# Patient Record
Sex: Female | Born: 1965 | Race: Black or African American | Hispanic: No
Health system: Southern US, Community
[De-identification: ages and names within clinical notes are randomized; demographics above are authoritative.]

---

## 2020-10-20 ENCOUNTER — Emergency Department (HOSPITAL_COMMUNITY)
Admission: EM | Admit: 2020-10-20 | Discharge: 2020-10-20 | Disposition: A | Discharge status: Home / Self Care | Payer: Medicare Other | Attending: Emergency Medicine | Admitting: Emergency Medicine

## 2020-10-20 ENCOUNTER — Emergency Department (HOSPITAL_COMMUNITY): Payer: Medicare Other

## 2020-10-20 ENCOUNTER — Encounter (HOSPITAL_COMMUNITY): Payer: Self-pay | Admitting: *Deleted

## 2020-10-20 DIAGNOSIS — M545 Low back pain, unspecified: Secondary | ICD-10-CM

## 2020-10-20 DIAGNOSIS — R1032 Left lower quadrant pain: Secondary | ICD-10-CM | POA: Insufficient documentation

## 2020-10-20 DIAGNOSIS — M25552 Pain in left hip: Secondary | ICD-10-CM | POA: Diagnosis not present

## 2020-10-20 LAB — URINALYSIS, ROUTINE W REFLEX MICROSCOPIC
Bilirubin Urine: NEGATIVE
Glucose, UA: NEGATIVE mg/dL
Hgb urine dipstick: NEGATIVE
Ketones, ur: NEGATIVE mg/dL
Leukocytes,Ua: NEGATIVE
Nitrite: NEGATIVE
Protein, ur: NEGATIVE mg/dL
Specific Gravity, Urine: 1.03 (ref 1.005–1.030)
pH: 7 (ref 5.0–8.0)

## 2020-10-20 LAB — CBC WITH DIFFERENTIAL/PLATELET
Abs Immature Granulocytes: 0.02 10*3/uL (ref 0.00–0.07)
Basophils Absolute: 0.1 10*3/uL (ref 0.0–0.1)
Basophils Relative: 1 %
Eosinophils Absolute: 0 10*3/uL (ref 0.0–0.5)
Eosinophils Relative: 1 %
HCT: 44.7 % (ref 36.0–46.0)
Hemoglobin: 13.8 g/dL (ref 12.0–15.0)
Immature Granulocytes: 0 %
Lymphocytes Relative: 37 %
Lymphs Abs: 2 10*3/uL (ref 0.7–4.0)
MCH: 33.4 pg (ref 26.0–34.0)
MCHC: 30.9 g/dL (ref 30.0–36.0)
MCV: 108.2 fL — ABNORMAL HIGH (ref 80.0–100.0)
Monocytes Absolute: 0.4 10*3/uL (ref 0.1–1.0)
Monocytes Relative: 7 %
Neutro Abs: 2.8 10*3/uL (ref 1.7–7.7)
Neutrophils Relative %: 54 %
Platelets: 127 10*3/uL — ABNORMAL LOW (ref 150–400)
RBC: 4.13 MIL/uL (ref 3.87–5.11)
RDW: 14.5 % (ref 11.5–15.5)
WBC: 5.3 10*3/uL (ref 4.0–10.5)
nRBC: 0 % (ref 0.0–0.2)

## 2020-10-20 LAB — COMPREHENSIVE METABOLIC PANEL
ALT: 31 U/L (ref 0–44)
AST: 69 U/L — ABNORMAL HIGH (ref 15–41)
Albumin: 3.7 g/dL (ref 3.5–5.0)
Alkaline Phosphatase: 130 U/L — ABNORMAL HIGH (ref 38–126)
Anion gap: 10 (ref 5–15)
BUN: 12 mg/dL (ref 6–20)
CO2: 26 mmol/L (ref 22–32)
Calcium: 9.4 mg/dL (ref 8.9–10.3)
Chloride: 100 mmol/L (ref 98–111)
Creatinine, Ser: 0.46 mg/dL (ref 0.44–1.00)
GFR, Estimated: >60 mL/min (ref >60)
Glucose, Bld: 98 mg/dL (ref 70–99)
Potassium: 4 mmol/L (ref 3.5–5.1)
Sodium: 136 mmol/L (ref 135–145)
Total Bilirubin: 1.5 mg/dL — ABNORMAL HIGH (ref 0.3–1.2)
Total Protein: 8.8 g/dL — ABNORMAL HIGH (ref 6.5–8.1)

## 2020-10-20 MED ORDER — IOHEXOL 350 MG/ML SOLN
100.0000 mL | Freq: Once | INTRAVENOUS | Status: AC | PRN
  Administered 2020-10-20: 80 mL via INTRAVENOUS

## 2020-10-20 MED ORDER — LIDOCAINE 5 % EX PTCH: 1.0000 | MEDICATED_PATCH | CUTANEOUS | 0 refills | Status: AC

## 2020-10-20 MED ORDER — PREDNISONE 50 MG PO TABS: 50.0000 mg | ORAL_TABLET | Freq: Every day | ORAL | 0 refills | Status: AC

## 2020-10-20 MED ORDER — SODIUM CHLORIDE (PF) 0.9 % IJ SOLN
INTRAMUSCULAR | Status: AC
  Filled 2020-10-20: qty 50

## 2020-10-20 MED ORDER — METHOCARBAMOL 500 MG PO TABS: 500.0000 mg | ORAL_TABLET | Freq: Two times a day (BID) | ORAL | 0 refills | Status: AC

## 2020-10-20 MED ORDER — OXYCODONE HCL 5 MG PO TABS: 5.0000 mg | ORAL_TABLET | Freq: Four times a day (QID) | ORAL | 0 refills | Status: AC | PRN

## 2020-10-20 MED ORDER — MORPHINE SULFATE (PF) 4 MG/ML IV SOLN
4.0000 mg | Freq: Once | INTRAVENOUS | Status: AC
  Administered 2020-10-20: 4 mg via INTRAVENOUS
  Filled 2020-10-20: qty 1

## 2020-10-20 MED ORDER — ONDANSETRON HCL 4 MG/2ML IJ SOLN
4.0000 mg | Freq: Once | INTRAMUSCULAR | Status: AC
  Administered 2020-10-20: 4 mg via INTRAVENOUS
  Filled 2020-10-20: qty 2

## 2020-10-20 MED ORDER — SODIUM CHLORIDE 0.9 % IV BOLUS
1000.0000 mL | Freq: Once | INTRAVENOUS | Status: AC
  Administered 2020-10-20: 1000 mL via INTRAVENOUS

## 2020-10-20 NOTE — ED Triage Notes (Signed)
Pt complains of left pelvic/hip pain radiating lto eft knee x 2 days. No injury.

## 2020-10-20 NOTE — ED Provider Notes (Signed)
[facility] DEPT Provider Note   CSN: [id number] Arrival date & time: 10/20/20  1253     History Chief Complaint  Patient presents with   Hip Pain    Heather Camacho is a 55 y.o. female with past medical history significant for multiple lumbar surgeries who presents for evaluation of left lower back, left lower abdominal pain.  Pain began 2 days ago.  Occasionally radiate into the leg.  Does not feel similar to prior back pain.  No history of IV drug use, bowel or bladder incontinence, saddle paresthesia.  No recent falls or injuries.  Pain worse with movement as well as urinating.  No dysuria, hematuria.  No history of kidney stones.  Does have some abdominal pain to left lower quadrant however no diarrhea, melena or blood per rectum.  Rates pain a 10/10.  No vaginal discharge, no concerns for STDs.  History obtained from patient and past medical records.  No interpreter used.  HPI     History reviewed. No pertinent past medical history.  There are no problems to display for this patient.   History reviewed. No pertinent surgical history.   OB History   No obstetric history on file.     No family history on file.     Home Medications Prior to Admission medications   Medication Sig Start Date End Date Taking? Authorizing Provider  lidocaine (LIDODERM) 5 % Place 1 patch onto the skin daily. Remove & Discard patch within 12 hours or as directed by MD 10/20/20  Yes Lyle Niblett A, PA-C  methocarbamol (ROBAXIN) 500 MG tablet Take 1 tablet (500 mg total) by mouth 2 (two) times daily. 10/20/20  Yes Kamille Toomey A, PA-C  oxyCODONE (ROXICODONE) 5 MG immediate release tablet Take 1 tablet (5 mg total) by mouth every 6 (six) hours as needed for up to 3 days for severe pain. 10/20/20 10/23/20 Yes Rashana Andrew A, PA-C  predniSONE (DELTASONE) 50 MG tablet Take 1 tablet (50 mg total) by mouth daily for 5 days. 10/20/20 10/25/20 Yes Yides Saidi A,  PA-C    Allergies    Patient has no allergy information on record.  Review of Systems   Review of Systems  Constitutional: Negative.   HENT: Negative.    Respiratory: Negative.    Cardiovascular: Negative.   Gastrointestinal:  Positive for abdominal pain. Negative for abdominal distention, anal bleeding, blood in stool, constipation, diarrhea, nausea and vomiting.  Genitourinary: Negative.   Musculoskeletal:  Positive for back pain. Negative for gait problem, neck pain and neck stiffness.  Skin: Negative.   Neurological: Negative.   All other systems reviewed and are negative.  Physical Exam Updated Vital Signs BP (!) 159/73   Pulse 77   Temp 98.5 F (36.9 C) (Oral)   Resp 18   SpO2 99%   Physical Exam Vitals and nursing note reviewed.  Constitutional:      General: She is not in acute distress.    Appearance: She is well-developed. She is not ill-appearing, toxic-appearing or diaphoretic.  HENT:     Head: Normocephalic and atraumatic.     Nose: Nose normal.     Mouth/Throat:     Mouth: Mucous membranes are moist.  Eyes:     Pupils: Pupils are equal, round, and reactive to light.  Cardiovascular:     Rate and Rhythm: Normal rate.     Pulses: Normal pulses.     Heart sounds: Normal heart sounds.  Pulmonary:  Effort: Pulmonary effort is normal. No respiratory distress.     Breath sounds: Normal breath sounds.  Abdominal:     General: Bowel sounds are normal. There is no distension.     Palpations: Abdomen is soft.     Tenderness: There is abdominal tenderness.  Musculoskeletal:        General: Tenderness present. No swelling, deformity or signs of injury. Normal range of motion.     Cervical back: Normal range of motion.     Right lower leg: No edema.     Left lower leg: No edema.     Comments: Diffuse tenderness to left lumbar paraspinal muscles.  Pain with range of motion to left leg however no gross positive straight leg raise bilaterally.  Compartments  soft.  No bony tenderness to lower extremities.  Intact sensation  Skin:    General: Skin is warm and dry.     Capillary Refill: Capillary refill takes less than 2 seconds.     Comments: No edema, erythema or warmth.  Neurological:     General: No focal deficit present.     Mental Status: She is alert and oriented to person, place, and time.     Comments: Ambulatory without difficulty Equal strength bilaterally Intact sensation bilaterally  Psychiatric:        Mood and Affect: Mood normal.    ED Results / Procedures / Treatments   Labs (all labs ordered are listed, but only abnormal results are displayed) Labs Reviewed  CBC WITH DIFFERENTIAL/PLATELET - Abnormal; Notable for the following components:      Result Value   MCV 108.2 (*)    Platelets 127 (*)    All other components within normal limits  COMPREHENSIVE METABOLIC PANEL - Abnormal; Notable for the following components:   Total Protein 8.8 (*)    AST 69 (*)    Alkaline Phosphatase 130 (*)    Total Bilirubin 1.5 (*)    All other components within normal limits  URINALYSIS, ROUTINE W REFLEX MICROSCOPIC    EKG None  Radiology DG Lumbar Spine Complete  Result Date: 10/20/2020 CLINICAL DATA:  LEFT lower back pain for 2 days, constant and radiating to lateral LEFT knee, multiple prior back surgeries EXAM: LUMBAR SPINE - COMPLETE 4+ VIEW COMPARISON:  None FINDINGS: Five non-rib-bearing lumbar vertebra. Levoconvex scoliosis. Prior posterior fusion L4-S1. Diffuse osseous demineralization. Disc space narrowing and minimal endplate spur formation at L2-L3. Vertebral body heights maintained without fracture or subluxation. No bone destruction or gross spondylolysis. SI joints preserved. IMPRESSION: Prior L4-S1 posterior fusion. Scattered degenerative disc disease changes lumbar spine with mild levoconvex scoliosis. No acute abnormalities. Electronically Signed   By: Lavonia Dana M.D.   On: 10/20/2020 14:19   CT Abdomen Pelvis W  Contrast  Result Date: 10/20/2020 CLINICAL DATA:  Left lower quadrant abdominal pain EXAM: CT ABDOMEN AND PELVIS WITH CONTRAST TECHNIQUE: Multidetector CT imaging of the abdomen and pelvis was performed using the standard protocol following bolus administration of intravenous contrast. CONTRAST:  52m OMNIPAQUE IOHEXOL 350 MG/ML SOLN COMPARISON:  None. FINDINGS: Lower chest: No acute pleural or parenchymal lung disease. Multi lead pacemaker identified. Hepatobiliary: Mild hepatic steatosis without focal liver abnormality. No biliary duct dilation. The gallbladder is unremarkable. Pancreas: Unremarkable. No pancreatic ductal dilatation or surrounding inflammatory changes. Spleen: Normal in size without focal abnormality. Adrenals/Urinary Tract: Adrenal glands are unremarkable. Kidneys are normal, without renal calculi, focal lesion, or hydronephrosis. Bladder is unremarkable. Stomach/Bowel: No bowel obstruction or ileus. Normal appendix  right lower quadrant. No bowel wall thickening or inflammatory change. Vascular/Lymphatic: No significant vascular findings are present. No enlarged abdominal or pelvic lymph nodes. Reproductive: Status post hysterectomy. No adnexal masses. Other: No free fluid or free gas.  No abdominal wall hernia. Musculoskeletal: No acute or destructive bony lesions. Postsurgical changes from L4 through S1. Reconstructed images demonstrate no additional findings. IMPRESSION: 1. No acute intra-abdominal or intrapelvic process. 2. Mild hepatic steatosis. Electronically Signed   By: Randa Ngo M.D.   On: 10/20/2020 15:49   CT L-SPINE NO CHARGE  Result Date: 10/20/2020 CLINICAL DATA:  Left low back pain radiating into the lateral aspect of the left for 2 days. History of prior lumbar surgery. No known injury. EXAM: CT LUMBAR SPINE WITHOUT CONTRAST TECHNIQUE: Multidetector CT imaging of the lumbar spine was performed without intravenous contrast administration. Multiplanar CT image  reconstructions were also generated. COMPARISON:  Plain films lumbar spine today. FINDINGS: Segmentation: Standard. Alignment: Maintained. Vertebrae: No fracture or focal lesion. The patient is status post L4-S1 posterior fusion and L4-5 anterior fusion. There is solid bridging bone across the disc interspaces and a solid posterior fusion mass at both levels. No hardware complication. Paraspinal and other soft tissues: See report of dedicated abdomen and pelvis CT today. Disc levels: T12-L1: Mild facet degenerative change. Otherwise negative. L1-2: Negative. L2-3: Mild facet degenerative change, more notable on the right. Otherwise negative. L3-4: Moderate bilateral facet degenerative disease. Otherwise negative. L4-5: Status post discectomy and fusion with a wide laminectomy defect on the right. No stenosis. L5-S1: Status post discectomy and fusion with a wide laminectomy defect on the right. No stenosis. IMPRESSION: No acute abnormality or finding to explain the patient's symptoms. The central canal and foramina appear open at all levels. The patient is status post L4-S1 fusion without evidence of complication. Electronically Signed   By: Inge Rise M.D.   On: 10/20/2020 15:34    Procedures Procedures   Medications Ordered in ED Medications  sodium chloride (PF) 0.9 % injection (has no administration in time range)  ondansetron (ZOFRAN) injection 4 mg (4 mg Intravenous Given 10/20/20 1432)  sodium chloride 0.9 % bolus 1,000 mL (0 mLs Intravenous Stopped 10/20/20 1559)  morphine 4 MG/ML injection 4 mg (4 mg Intravenous Given 10/20/20 1432)  iohexol (OMNIPAQUE) 350 MG/ML injection 100 mL (80 mLs Intravenous Contrast Given 10/20/20 1503)    ED Course  I have reviewed the triage vital signs and the nursing notes.  Pertinent labs & imaging results that were available during my care of the patient were reviewed by me and considered in my medical decision making (see chart for details).  Patient here  for evaluation of left lower back pain.  Afebrile, nonseptic, not ill-appearing occasionally radiates into her left lower leg.  Occasionally also has some left lower quadrant abdominal pain.  No change in bowel movements, melena, bright blood per rectum.  No urinary complaints.  Pain worse with movement as well as urinating.  No bowel or bladder incontinence, saddle paresthesia, history of IV drug use.  She is ambulatory here without difficulty.  Able to reproduce her pain on exam.  Given she has had some intermittent left lower abdominal pain will obtain labs and imaging.  Labs and imaging personally reviewed and interpreted: CBC without leukocytosis Metabolic panel with mild elevation alk phos, T bili, right upper quadrant abdomen soft, nontender.  No dilated ducts on CT, will have follow-up outpatient with PCP.  Does have hepatic steatosis on imaging UA negative  for infection CT abdomen and pelvis without any acute abnormality Reconstituted CT lumbar without any acute abnormality  Patient reassessed.  She is tolerating p.o. intake.  Pain controlled.  Given I am able to reproduce her pain feels is likely musculoskeletal in etiology.  Low suspicion for acute intra-abdominal, neurosurgical emergency as cause of her pain.  DC home with symptomatic management.  On repeat exam patient does not have a surgical abdomin and there are no peritoneal signs.  No indication of appendicitis, bowel obstruction, bowel perforation, cholecystitis, diverticulitis, AAA, dissection, PID or ectopic pregnancy, occult fracture.    The patient has been appropriately medically screened and/or stabilized in the ED. I have low suspicion for any other emergent medical condition which would require further screening, evaluation or treatment in the ED or require inpatient management.  Patient is hemodynamically stable and in no acute distress.  Patient able to ambulate in department prior to ED.  Evaluation does not show acute  pathology that would require ongoing or additional emergent interventions while in the emergency department or further inpatient treatment.  I have discussed the diagnosis with the patient and answered all questions.  Pain is been managed while in the emergency department and patient has no further complaints prior to discharge.  Patient is comfortable with plan discussed in room and is stable for discharge at this time.  I have discussed strict return precautions for returning to the emergency department.  Patient was encouraged to follow-up with PCP/specialist refer to at discharge.        MDM Rules/Calculators/A&P                           Final Clinical Impression(s) / ED Diagnoses Final diagnoses:  Low back pain    Rx / DC Orders ED Discharge Orders          Ordered    oxyCODONE (ROXICODONE) 5 MG immediate release tablet  Every 6 hours PRN        10/20/20 1702    lidocaine (LIDODERM) 5 %  Every 24 hours        10/20/20 1702    methocarbamol (ROBAXIN) 500 MG tablet  2 times daily        10/20/20 1702    predniSONE (DELTASONE) 50 MG tablet  Daily        10/20/20 1702             Halo Laski A, PA-C 10/20/20 1703    Valarie Merino, MD 10/21/20 [phone]

## 2020-10-20 NOTE — ED Provider Notes (Signed)
Emergency Medicine Provider Triage Evaluation Note  Heather Camacho , a 55 y.o. female  was evaluated in triage.  Pt complains of left lower back pain.  Multiple prior back surgeries.  Does not feel similar.  Pain began 2 days ago.  Pain is constant.  Does radiate into left lateral aspect knee.  Pain worse with movement as well as urinating.  No bowel or bladder incontinence, saddle paresthesia.  No dysuria, hematuria.  No history of kidney stones.  Review of Systems  Positive: Left lower back pain Negative: Dysuria, hematuria, chest pain, shortness of breath, saddle paresthesia  Physical Exam  BP (!) 166/87 (BP Location: Right Arm)   Pulse 86   Temp 98.5 F (36.9 C) (Oral)   Resp 18   SpO2 100%  Gen:   Awake, no distress   Resp:  Normal effort  MSK:   Moves extremities without difficulty  Other:  Intact sensation to LE  Medical Decision Making  Medically screening exam initiated at 1:18 PM.  Appropriate orders placed.  [name] was informed that the remainder of the evaluation will be completed by another provider, this initial triage assessment does not replace that evaluation, and the importance of remaining in the ED until their evaluation is complete.  Lower back pain  VS stable, labs, imaging ordered   [name], PA-C 10/20/20 1319    [name], MD 10/21/20 [phone]

## 2020-10-20 NOTE — Discharge Instructions (Addendum)
Take the medications as prescribed.  Do not drive or operate heavy machinery while taking these medications.  Follow-up with your primary care doctor in a few days if your symptoms do not improve.

## 2020-10-20 NOTE — ED Notes (Signed)
An After Visit Summary was printed and given to the patient. Discharge instructions given and no further questions at this time.  

## 2020-10-20 NOTE — ED Notes (Signed)
Pt in restroom to give UA.

## 2022-01-25 IMAGING — CT CT ABD-PELV W/ CM
3 of 5 series · 17 of 46 positions shown, 19 images · IV contrast (OMNIPAQUE 300)
Comparison: None.

CLINICAL DATA: Left lower quadrant abdominal pain

EXAM:
CT ABDOMEN AND PELVIS WITH CONTRAST
TECHNIQUE: Multidetector CT imaging of the abdomen and pelvis was performed
using the standard protocol following bolus administration of
intravenous contrast.
CONTRAST:  80mL OMNIPAQUE IOHEXOL 350 MG/ML SOLN

[Series 2: axial st · axial · 0.75mm/px · z∈[+1346,+1711]mm · 12 of 87 slices shown, 14 images]
[im 7/87  soft-tissue]
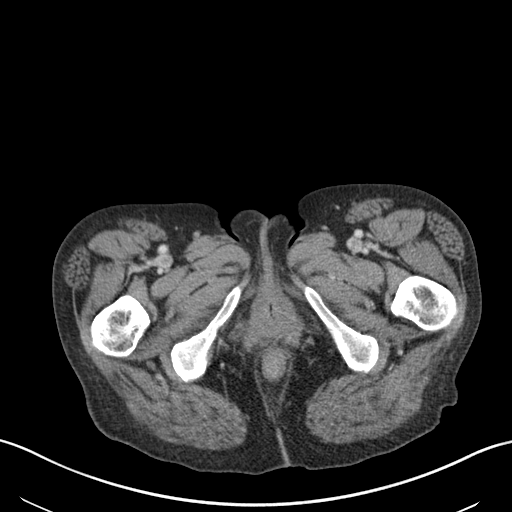
[im 7/87  bone]
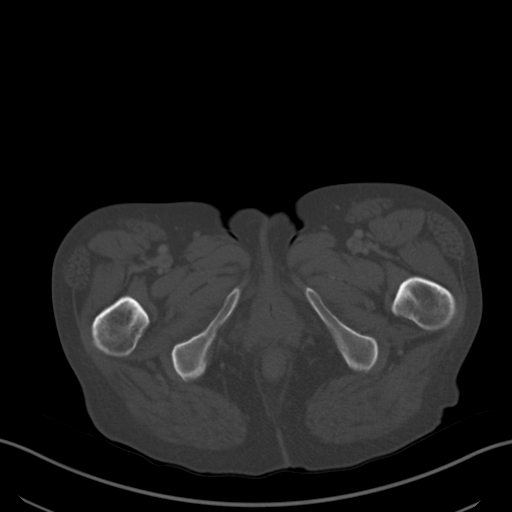
[im 14/87  soft-tissue]
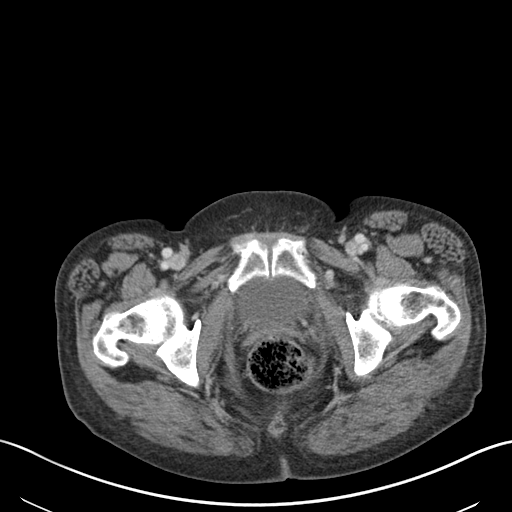
[im 20/87  soft-tissue]
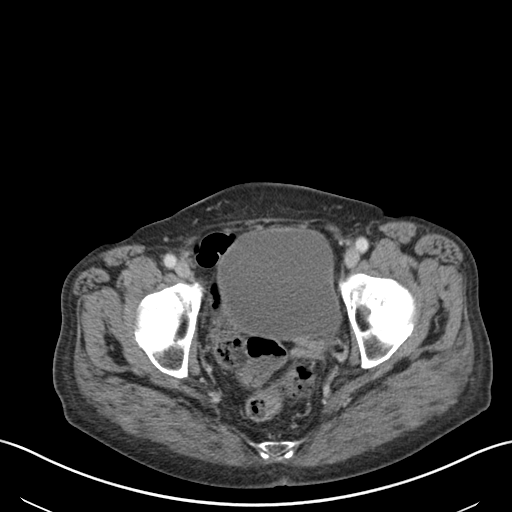
[im 27/87  soft-tissue]
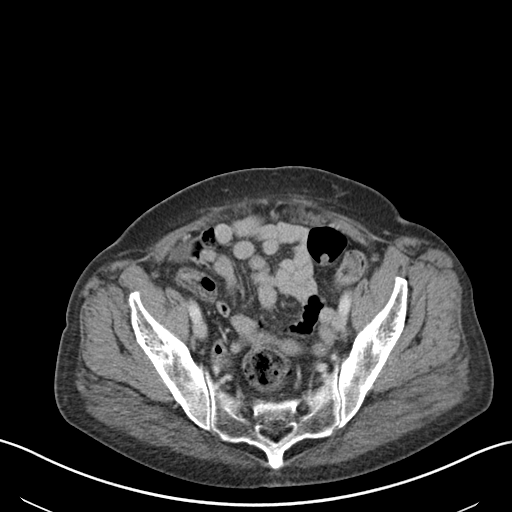
[im 34/87  soft-tissue]
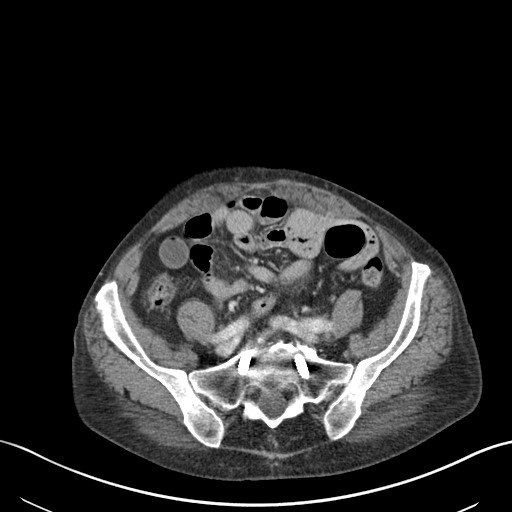
[im 40/87  soft-tissue]
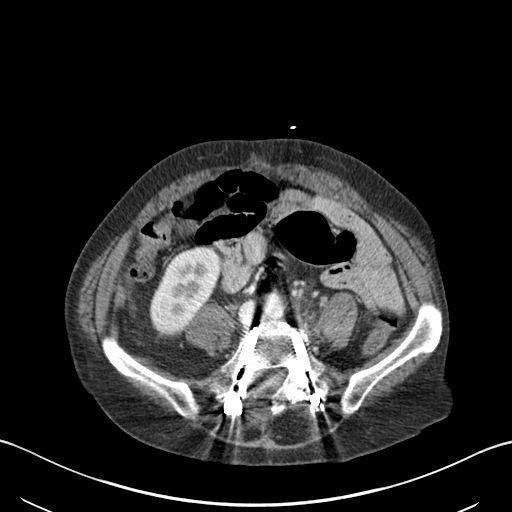
[im 47/87  soft-tissue]
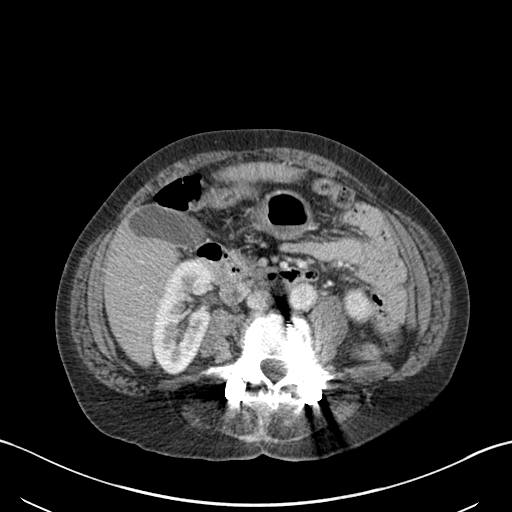
[im 53/87  soft-tissue]
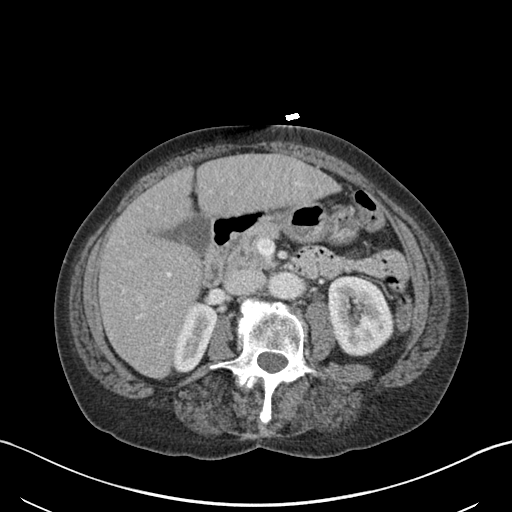
[im 60/87  soft-tissue]
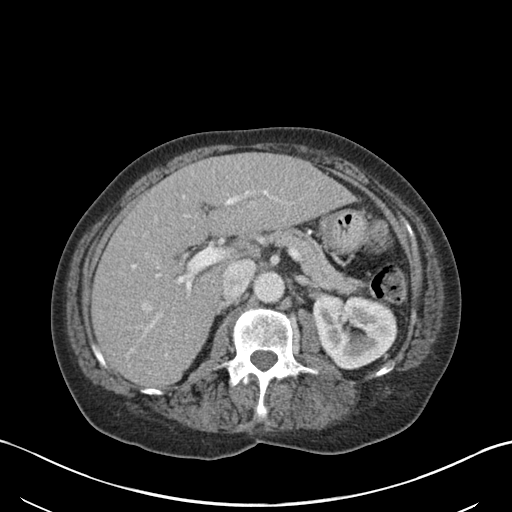
[im 60/87  bone]
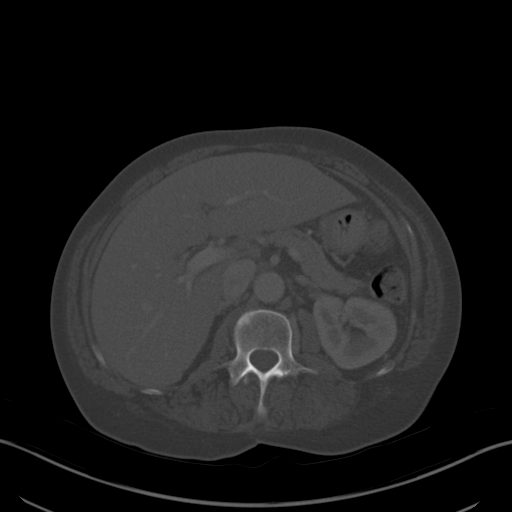
[im 67/87  soft-tissue]
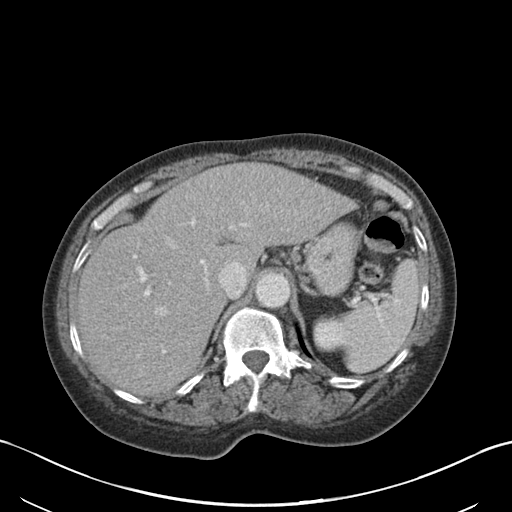
[im 73/87  soft-tissue]
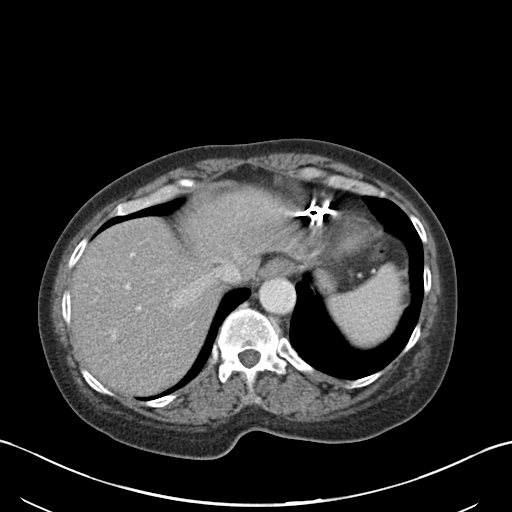
[im 80/87  soft-tissue]
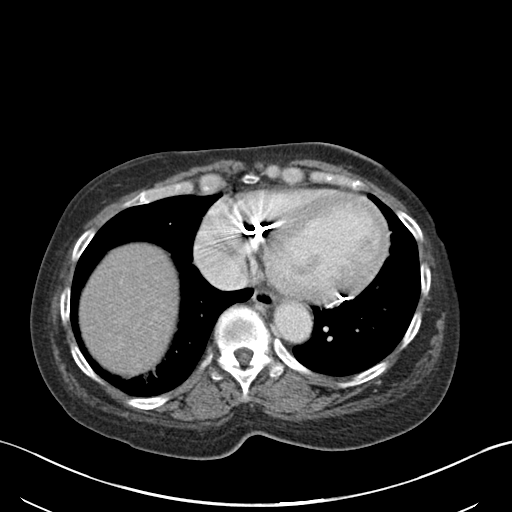

[Series 4: lung bases · axial · 0.75mm/px · z∈[+1604,+1618]mm · 2 of 79 slices shown]
[im 8/79  bone]
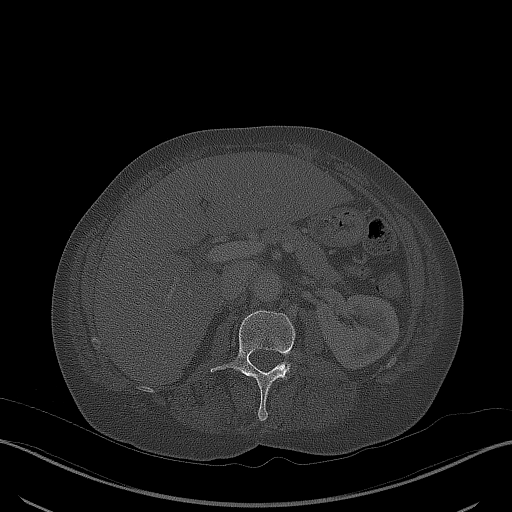
[im 15/79  bone]
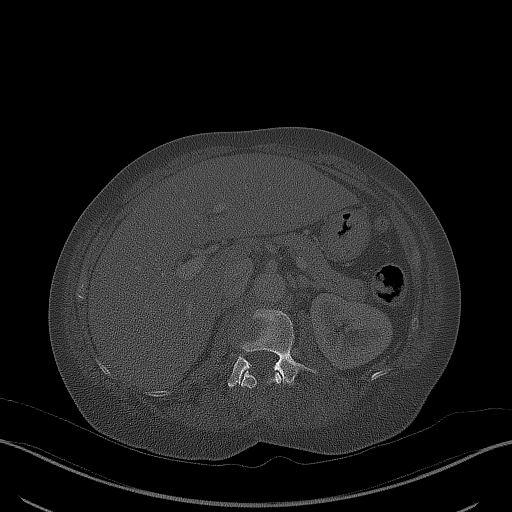

[Series 5: coronal st · coronal · 0.81mm/px · 3 of 129 slices shown]
[im 43/129  soft-tissue]
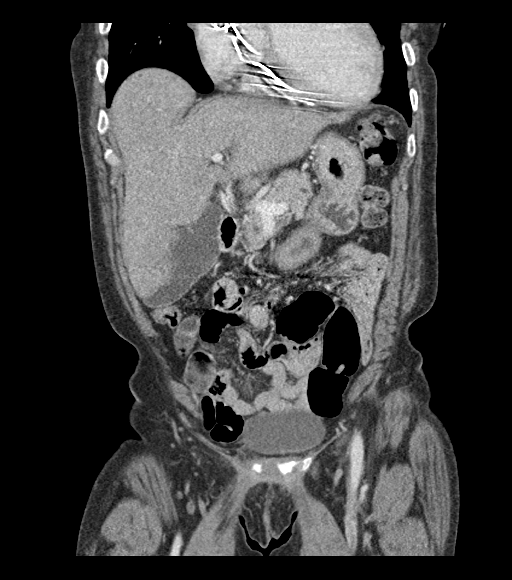
[im 57/129  soft-tissue]
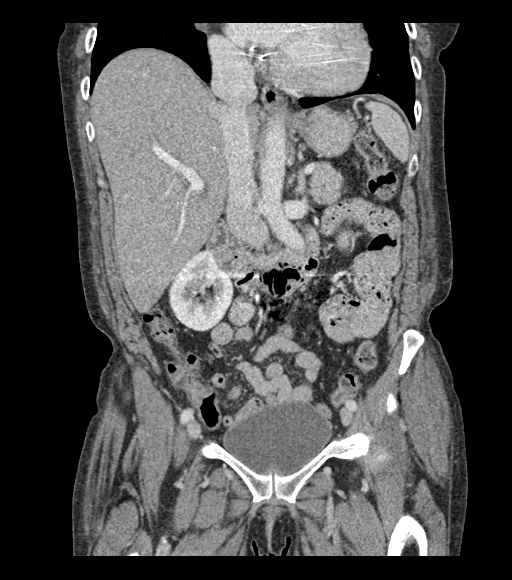
[im 72/129  soft-tissue]
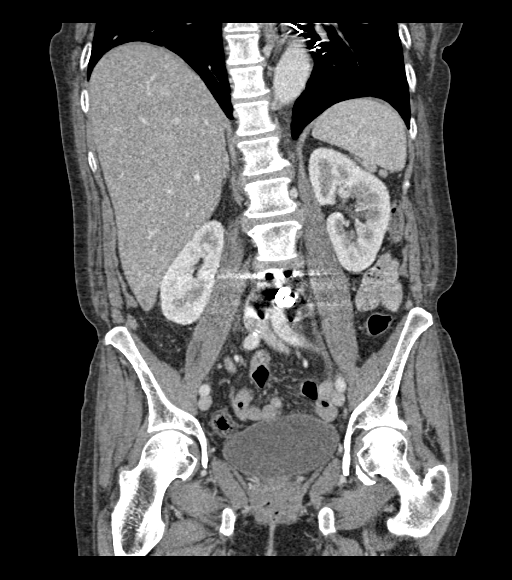

[17 of 46 positions shown; findings below may reference images not displayed]

FINDINGS: Lower chest: No acute pleural or parenchymal lung disease. Multi
lead pacemaker identified.

Hepatobiliary: Mild hepatic steatosis without focal liver
abnormality. No biliary duct dilation. The gallbladder is
unremarkable.

Pancreas: Unremarkable. No pancreatic ductal dilatation or
surrounding inflammatory changes.

Spleen: Normal in size without focal abnormality.

Adrenals/Urinary Tract: Adrenal glands are unremarkable. Kidneys are
normal, without renal calculi, focal lesion, or hydronephrosis.
Bladder is unremarkable.

Stomach/Bowel: No bowel obstruction or ileus. Normal appendix right
lower quadrant. No bowel wall thickening or inflammatory change.

Vascular/Lymphatic: No significant vascular findings are present. No
enlarged abdominal or pelvic lymph nodes.

Reproductive: Status post hysterectomy. No adnexal masses.

Other: No free fluid or free gas.  No abdominal wall hernia.

Musculoskeletal: No acute or destructive bony lesions. Postsurgical
changes from L4 through S1. Reconstructed images demonstrate no
additional findings.
IMPRESSION: 1. No acute intra-abdominal or intrapelvic process.
2. Mild hepatic steatosis.

## 2022-01-25 IMAGING — CT CT L SPINE W/O CM
4 of 5 series · 17 of 33 positions shown, 19 images · IV contrast (OMNIPAQUE 300)
Comparison: Plain films lumbar spine today.

CLINICAL DATA: Left low back pain radiating into the lateral aspect
of the left for 2 days. History of prior lumbar surgery. No known
injury.

EXAM:
CT LUMBAR SPINE WITHOUT CONTRAST
TECHNIQUE: Multidetector CT imaging of the lumbar spine was performed without
intravenous contrast administration. Multiplanar CT image
reconstructions were also generated.

[Series 503: st axial lspine · axial · 0.41mm/px · z∈[+1501,+1639]mm · 5 of 102 slices shown, 7 images]
[im 17/102  soft-tissue]
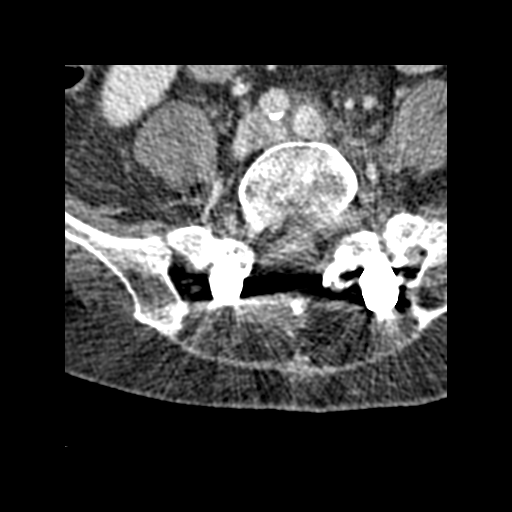
[im 17/102  bone]
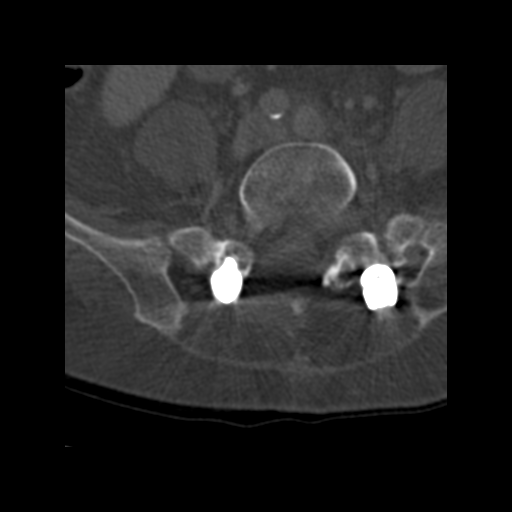
[im 34/102  bone]
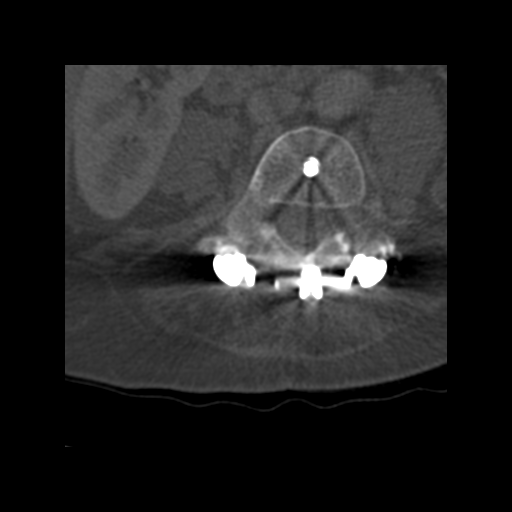
[im 51/102  bone]
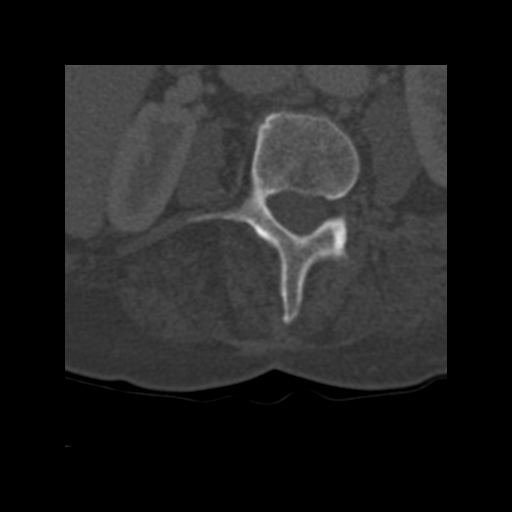
[im 68/102  bone]
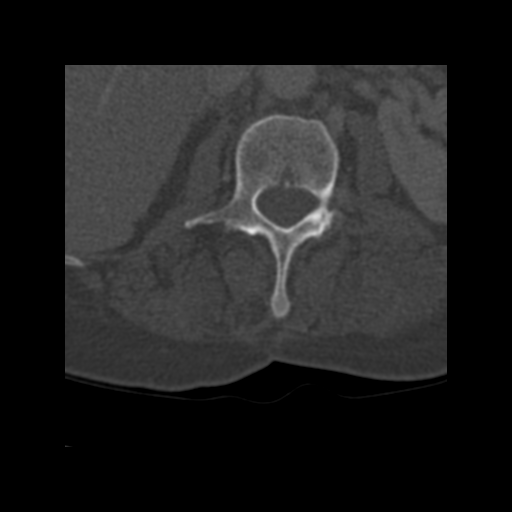
[im 85/102  soft-tissue]
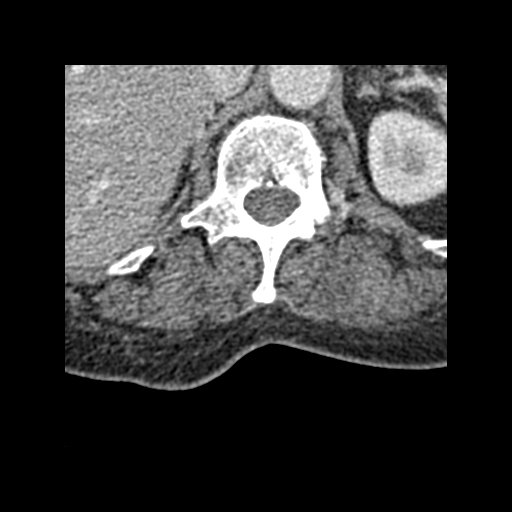
[im 85/102  bone]
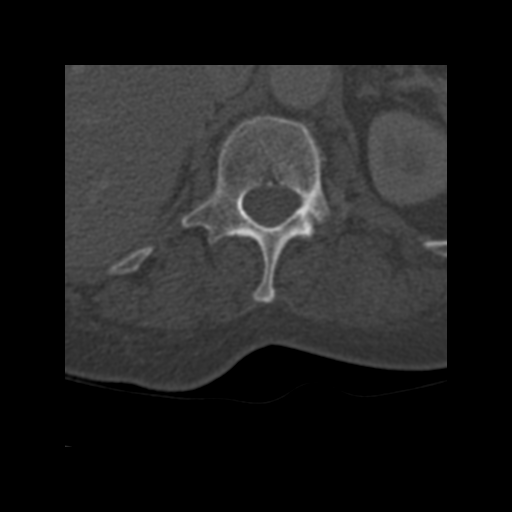

[Series 504: lspine cor st · coronal · 0.41mm/px · 3 of 57 slices shown]
[im 12/57  bone]
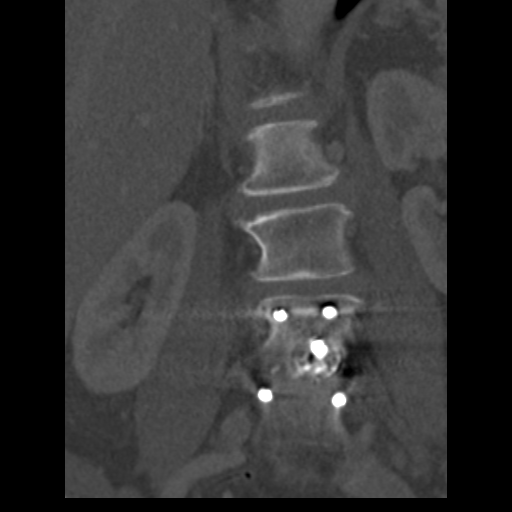
[im 23/57  bone]
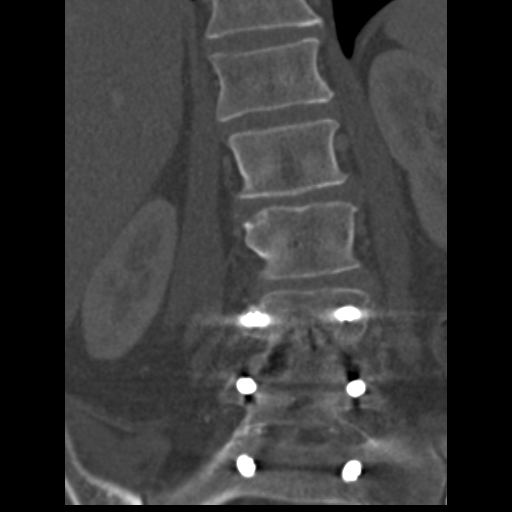
[im 34/57  bone]
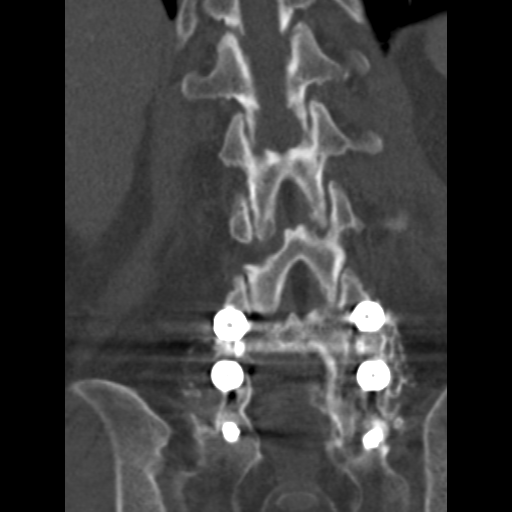

[Series 505: st sag lspine · sagittal · 0.41mm/px · 5 of 53 slices shown]
[im 9/53  bone]
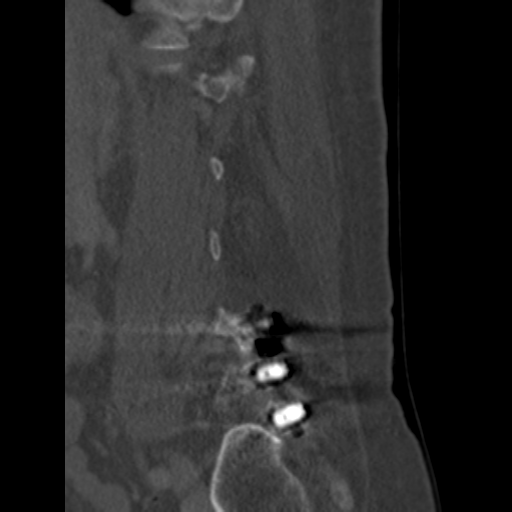
[im 18/53  bone]
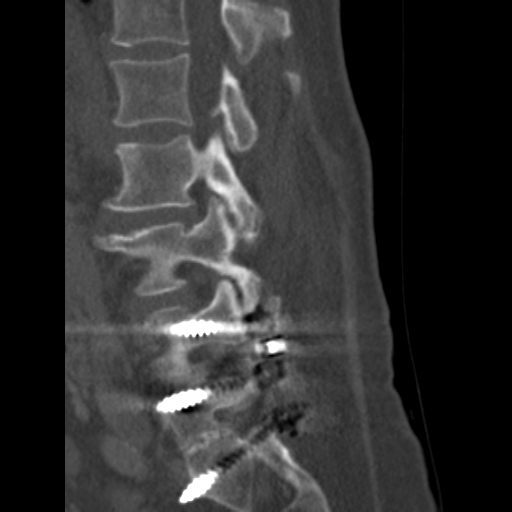
[im 27/53  bone]
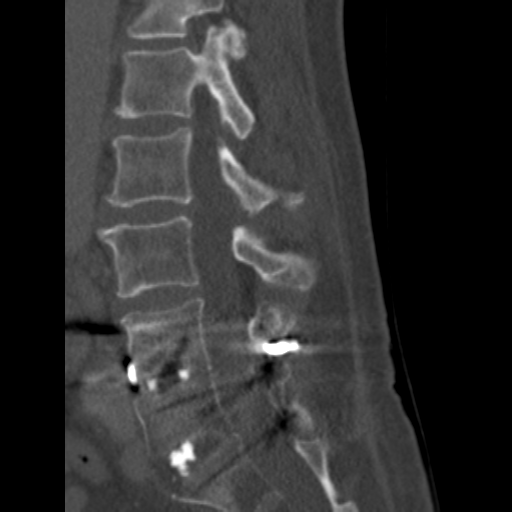
[im 35/53  bone]
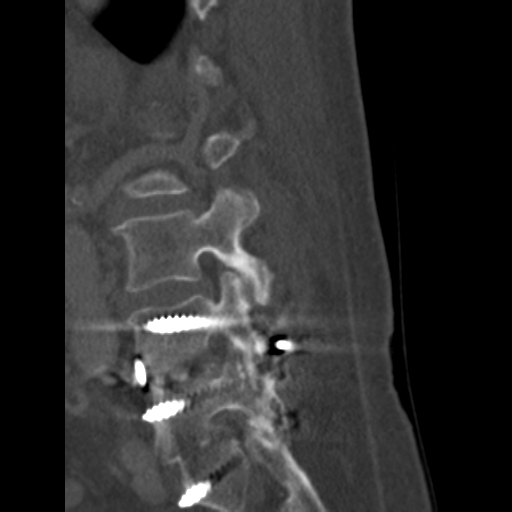
[im 44/53  bone]
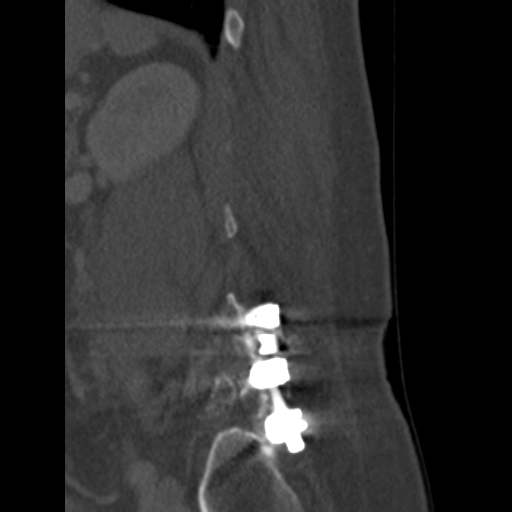

[Series 507: lspine bone axial · axial · 0.41mm/px · z∈[+1492,+1596]mm · 4 of 105 slices shown]
[im 18/105  bone]
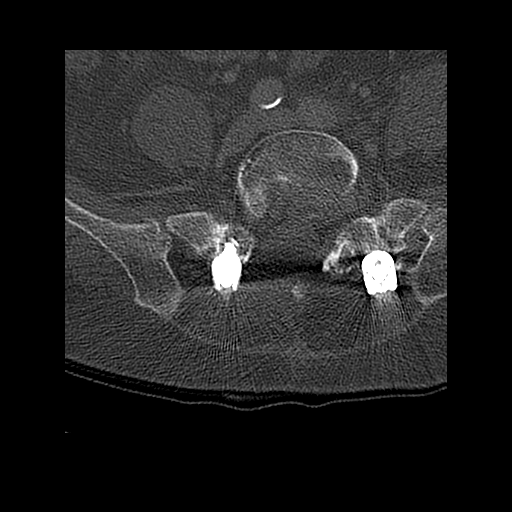
[im 35/105  bone]
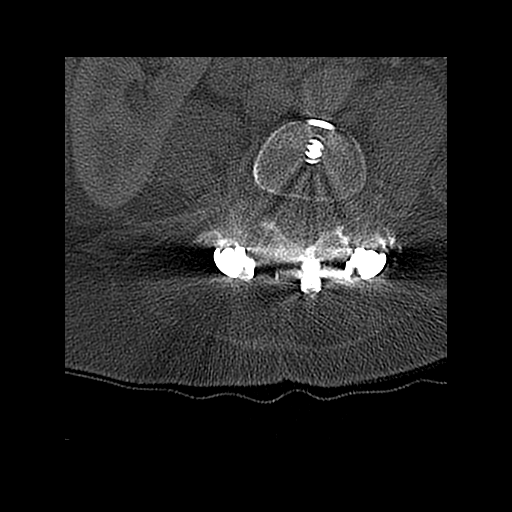
[im 53/105  bone]
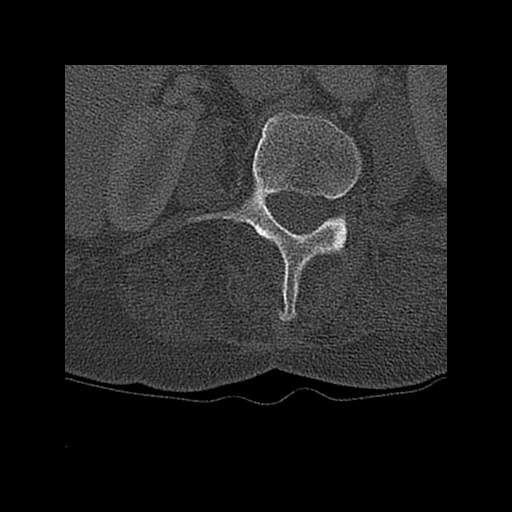
[im 70/105  bone]
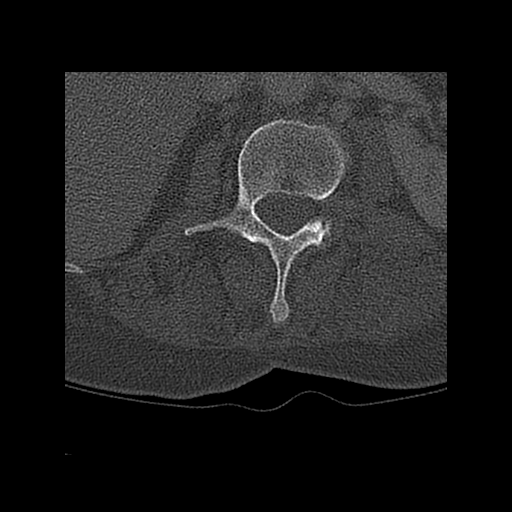

[17 of 33 positions shown; findings below may reference images not displayed]

FINDINGS: Segmentation: Standard.

Alignment: Maintained.

Vertebrae: No fracture or focal lesion. The patient is status post
L4-S1 posterior fusion and L4-5 anterior fusion. There is solid
bridging bone across the disc interspaces and a solid posterior
fusion mass at both levels. No hardware complication.

Paraspinal and other soft tissues: See report of dedicated abdomen
and pelvis CT today.

Disc levels: T12-L1: Mild facet degenerative change. Otherwise
negative.

L1-2: Negative.

L2-3: Mild facet degenerative change, more notable on the right.
Otherwise negative.

L3-4: Moderate bilateral facet degenerative disease. Otherwise
negative.

L4-5: Status post discectomy and fusion with a wide laminectomy
defect on the right. No stenosis.

L5-S1: Status post discectomy and fusion with a wide laminectomy
defect on the right. No stenosis.
IMPRESSION: No acute abnormality or finding to explain the patient's symptoms.
The central canal and foramina appear open at all levels. The
patient is status post L4-S1 fusion without evidence of
complication.

## 2023-05-04 DEATH — deceased
